# Patient Record
Sex: Female | Born: 1995 | Race: White | Hispanic: No | Marital: Married | Smoking: Never smoker
Health system: Southern US, Community
[De-identification: ages and names within clinical notes are randomized; demographics above are authoritative.]

## PROBLEM LIST (undated history)

## (undated) DIAGNOSIS — O039 Complete or unspecified spontaneous abortion without complication: Secondary | ICD-10-CM

## (undated) HISTORY — DX: Complete or unspecified spontaneous abortion without complication: O03.9

---
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
## 2017-11-10 NOTE — Progress Notes (Signed)
Subjective:    Patient ID: Katelyn Patton, female    DOB: Nov 24, 1995, 22 y.o.   MRN: [id number]  HPI:  [name] presents to establish as a new pt.  She is a pleasant 22 year old female. PMH: She denies chronic medical conditions or daily rx medications.  She reports OTC prenatal vitamins She estimates to drink >100 oz water/day and has been reducing sugar/CHO intake She is currently breastfeeding and plans to continue to until her daughter turns 1 (she is currently 592 months old). She denies formal exercise, but plans on increasing daily walking, swimming, and yoga practice She is married and currently doesn't work outside of the house   Patient Care Team    Relationship Specialty Notifications Start End  Rhiannan Kievit, Katelyn BlossomKaty D, NP PCP - General Family Medicine  11/11/17     Patient Active Problem List   Diagnosis Date Noted  . Healthcare maintenance 11/11/2017     Past Medical History:  Diagnosis Date  . SAB (spontaneous abortion)    history     History reviewed. No pertinent surgical history.   Family History  Problem Relation Age of Onset  . Alcohol abuse Mother   . Alcohol abuse Father   . Cancer Maternal Grandmother   . Diabetes Maternal Grandmother   . Cancer Maternal Grandfather   . Diabetes Maternal Grandfather   . Cancer Paternal Grandfather   . COPD Paternal Grandfather   . Diabetes Paternal Grandfather   . Diabetes Paternal Grandmother      Social History   Substance and Sexual Activity  Drug Use No     Social History   Substance and Sexual Activity  Alcohol Use No  . Alcohol/week: 0.0 oz     Social History   Tobacco Use  Smoking Status Never Smoker  Smokeless Tobacco Never Used     Outpatient Encounter Medications as of 11/11/2017  Medication Sig  . [DISCONTINUED] drospirenone-ethinyl estradiol (YAZ) 3-0.02 MG tablet Take 1 tablet by mouth daily.  . [DISCONTINUED] ondansetron (ZOFRAN ODT) 4 MG disintegrating tablet Take 1 tablet (4 mg  total) by mouth every 8 (eight) hours as needed for nausea or vomiting.  . [DISCONTINUED] oseltamivir (TAMIFLU) 75 MG capsule Take 1 capsule (75 mg total) by mouth every 12 (twelve) hours.   No facility-administered encounter medications on file as of 11/11/2017.     Allergies: Cinnamon and Lactose intolerance (gi)  Body mass index is 34.65 kg/m.  Blood pressure 119/79, pulse 84, height 5' 2.09" (1.577 m), weight 190 lb (86.2 kg), last menstrual period 10/28/2017, SpO2 96 %. Review of Systems  Constitutional: Negative for activity change, appetite change, chills, diaphoresis, fatigue, fever and unexpected weight change.  HENT: Negative for congestion.   Eyes: Negative for visual disturbance.  Respiratory: Negative for cough, chest tightness, shortness of breath, wheezing and stridor.   Cardiovascular: Negative for chest pain, palpitations and leg swelling.  Gastrointestinal: Negative for abdominal distention, abdominal pain, blood in stool, constipation, diarrhea, nausea and vomiting.  Endocrine: Negative for cold intolerance, heat intolerance, polydipsia, polyphagia and polyuria.  Genitourinary: Negative for difficulty urinating and flank pain.  Musculoskeletal: Negative for arthralgias, back pain, gait problem, joint swelling, myalgias, neck pain and neck stiffness.  Skin: Negative for color change, pallor, rash and wound.  Neurological: Negative for dizziness and headaches.  Hematological: Does not bruise/bleed easily.  Psychiatric/Behavioral: Negative for confusion, decreased concentration, dysphoric mood, hallucinations, self-injury, sleep disturbance and suicidal ideas. The patient is not nervous/anxious and is not hyperactive.  Objective:   Physical Exam  Constitutional: She is oriented to person, place, and time. She appears well-developed and well-nourished. No distress.  HENT:  Head: Normocephalic and atraumatic.  Right Ear: External ear normal.  Left Ear: External  ear normal.  Cardiovascular: Normal rate, regular rhythm, normal heart sounds and intact distal pulses.  Pulmonary/Chest: Effort normal and breath sounds normal. No stridor. No respiratory distress. She has no wheezes. She has no rales. She exhibits no tenderness.  Neurological: She is alert and oriented to person, place, and time.  Skin: Skin is warm and dry. Capillary refill takes less than 2 seconds. No rash noted. She is not diaphoretic. No erythema. No pallor.  Psychiatric: She has a normal mood and affect. Her behavior is normal. Judgment and thought content normal.  Nursing note and vitals reviewed.     Assessment & Plan:   1. Healthcare maintenance     Healthcare maintenance Continue your excellent water intake and follow Mediterranean diet Increase regular exercise.  Recommend at least 30 minutes daily, 5 days per week of walking, jogging, biking, swimming, YouTube/Pinterest workout videos. Recommend annual physical.  FOLLOW-UP:  Return in about 1 year (around 11/12/2018) for CPE.

## 2017-11-11 ENCOUNTER — Ambulatory Visit (INDEPENDENT_AMBULATORY_CARE_PROVIDER_SITE_OTHER): Payer: PRIVATE HEALTH INSURANCE | Admitting: Adult Health

## 2017-11-11 ENCOUNTER — Encounter: Payer: Self-pay | Admitting: Adult Health

## 2017-11-11 VITALS — BP 119/79 | HR 84 | Ht 62.09 in | Wt 190.0 lb

## 2017-11-11 DIAGNOSIS — Z0000 Encounter for general adult medical examination without abnormal findings: Secondary | ICD-10-CM | POA: Diagnosis not present

## 2017-11-11 NOTE — Patient Instructions (Signed)
Mediterranean Diet A Mediterranean diet refers to food and lifestyle choices that are based on the traditions of countries located on the [place]. This way of eating has been shown to help prevent certain conditions and improve outcomes for people who have chronic diseases, like kidney disease and heart disease. What are tips for following this plan? Lifestyle  Cook and eat meals together with your family, when possible.  Drink enough fluid to keep your urine clear or pale yellow.  Be physically active every day. This includes: ? Aerobic exercise like running or swimming. ? Leisure activities like gardening, walking, or housework.  Get 7-8 hours of sleep each night.  If recommended by your health care provider, drink red wine in moderation. This means 1 glass a day for nonpregnant women and 2 glasses a day for men. A glass of wine equals 5 oz (150 mL). Reading food labels  Check the serving size of packaged foods. For foods such as rice and pasta, the serving size refers to the amount of cooked product, not dry.  Check the total fat in packaged foods. Avoid foods that have saturated fat or trans fats.  Check the ingredients list for added sugars, such as corn syrup. Shopping  At the grocery store, buy most of your food from the areas near the walls of the store. This includes: ? Fresh fruits and vegetables (produce). ? Grains, beans, nuts, and seeds. Some of these may be available in unpackaged forms or large amounts (in bulk). ? Fresh seafood. ? Poultry and eggs. ? Low-fat dairy products.  Buy whole ingredients instead of prepackaged foods.  Buy fresh fruits and vegetables in-season from local farmers markets.  Buy frozen fruits and vegetables in resealable bags.  If you do not have access to quality fresh seafood, buy precooked frozen shrimp or canned fish, such as tuna, salmon, or sardines.  Buy small amounts of raw or cooked vegetables, salads, or olives from the  deli or salad bar at your store.  Stock your pantry so you always have certain foods on hand, such as olive oil, canned tuna, canned tomatoes, rice, pasta, and beans. Cooking  Cook foods with extra-virgin olive oil instead of using butter or other vegetable oils.  Have meat as a side dish, and have vegetables or grains as your main dish. This means having meat in small portions or adding small amounts of meat to foods like pasta or stew.  Use beans or vegetables instead of meat in common dishes like chili or lasagna.  Experiment with different cooking methods. Try roasting or broiling vegetables instead of steaming or sauteing them.  Add frozen vegetables to soups, stews, pasta, or rice.  Add nuts or seeds for added healthy fat at each meal. You can add these to yogurt, salads, or vegetable dishes.  Marinate fish or vegetables using olive oil, lemon juice, garlic, and fresh herbs. Meal planning  Plan to eat 1 vegetarian meal one day each week. Try to work up to 2 vegetarian meals, if possible.  Eat seafood 2 or more times a week.  Have healthy snacks readily available, such as: ? Vegetable sticks with hummus. ? Greek yogurt. ? Fruit and nut trail mix.  Eat balanced meals throughout the week. This includes: ? Fruit: 2-3 servings a day ? Vegetables: 4-5 servings a day ? Low-fat dairy: 2 servings a day ? Fish, poultry, or lean meat: 1 serving a day ? Beans and legumes: 2 or more servings a week ? Nuts   and seeds: 1-2 servings a day ? Whole grains: 6-8 servings a day ? Extra-virgin olive oil: 3-4 servings a day  Limit red meat and sweets to only a few servings a month What are my food choices?  Mediterranean diet ? Recommended ? Grains: Whole-grain pasta. Brown rice. Bulgar wheat. Polenta. Couscous. Whole-wheat bread. [place]. ? Vegetables: Artichokes. Beets. Broccoli. Cabbage. Carrots. Eggplant. Green beans. Chard. Kale. Spinach. Onions. Leeks. Peas. Squash.  Tomatoes. Peppers. Radishes. ? Fruits: Apples. Apricots. Avocado. Berries. Bananas. Cherries. Dates. Figs. Grapes. Lemons. Melon. Oranges. Peaches. Plums. Pomegranate. ? Meats and other protein foods: Beans. Almonds. Sunflower seeds. Pine nuts. Peanuts. [place]. Salmon. Scallops. Shrimp. [place]. Tilapia. Clams. Oysters. Eggs. ? Dairy: Low-fat milk. Cheese. Greek yogurt. ? Beverages: Water. Red wine. Herbal tea. ? Fats and oils: Extra virgin olive oil. Avocado oil. Grape seed oil. ? Sweets and desserts: [place] yogurt with honey. Baked apples. Poached pears. Trail mix. ? Seasoning and other foods: Basil. Cilantro. Coriander. Cumin. Mint. Parsley. Sage. Rosemary. Tarragon. Garlic. Oregano. Thyme. Pepper. Balsalmic vinegar. Tahini. Hummus. Tomato sauce. Olives. Mushrooms. ? Limit these ? Grains: Prepackaged pasta or rice dishes. Prepackaged cereal with added sugar. ? Vegetables: Deep fried potatoes (french fries). ? Fruits: Fruit canned in syrup. ? Meats and other protein foods: Beef. Pork. Lamb. Poultry with skin. Hot dogs. Berniece Salines. ? Dairy: Ice cream. Sour cream. Whole milk. ? Beverages: Juice. Sugar-sweetened soft drinks. Beer. Liquor and spirits. ? Fats and oils: Butter. Canola oil. Vegetable oil. Beef fat (tallow). Lard. ? Sweets and desserts: Cookies. Cakes. Pies. Candy. ? Seasoning and other foods: Mayonnaise. Premade sauces and marinades. ? The items listed may not be a complete list. Talk with your dietitian about what dietary choices are right for you. Summary  The Mediterranean diet includes both food and lifestyle choices.  Eat a variety of fresh fruits and vegetables, beans, nuts, seeds, and whole grains.  Limit the amount of red meat and sweets that you eat.  Talk with your health care provider about whether it is safe for you to drink red wine in moderation. This means 1 glass a day for nonpregnant women and 2 glasses a day for men. A glass of wine equals 5 oz (150 mL). This information  is not intended to replace advice given to you by your health care provider. Make sure you discuss any questions you have with your health care provider. Document Released: 01/03/2016 Document Revised: 02/05/2016 Document Reviewed: 01/03/2016 Elsevier Interactive Patient Education  2018 [employer].   Exercising to [employer] Exercising can help you to lose weight. In order to lose weight through exercise, you need to do vigorous-intensity exercise. You can tell that you are exercising with vigorous intensity if you are breathing very hard and fast and cannot hold a conversation while exercising. Moderate-intensity exercise helps to maintain your current weight. You can tell that you are exercising at a moderate level if you have a higher heart rate and faster breathing, but you are still able to hold a conversation. How often should I exercise? Choose an activity that you enjoy and set realistic goals. Your health care provider can help you to make an activity plan that works for you. Exercise regularly as directed by your health care provider. This may include:  Doing resistance training twice each week, such as: ? Push-ups. ? Sit-ups. ? Lifting weights. ? Using resistance bands.  Doing a given intensity of exercise for a given amount of time. Choose from these options: ? 150  minutes of moderate-intensity exercise every week. ? 75 minutes of vigorous-intensity exercise every week. ? A mix of moderate-intensity and vigorous-intensity exercise every week.  Children, pregnant women, people who are out of shape, people who are overweight, and older adults may need to consult a health care provider for individual recommendations. If you have any sort of medical condition, be sure to consult your health care provider before starting a new exercise program. What are some activities that can help me to lose weight?  Walking at a rate of at least 4.5 miles an hour.  Jogging or running at a  rate of 5 miles per hour.  Biking at a rate of at least 10 miles per hour.  Lap swimming.  Roller-skating or in-line skating.  Cross-country skiing.  Vigorous competitive sports, such as football, basketball, and soccer.  Jumping rope.  Aerobic dancing. How can I be more active in my day-to-day activities?  Use the stairs instead of the elevator.  Take a walk during your lunch break.  If you drive, park your car farther away from work or school.  If you take public transportation, get off one stop early and walk the rest of the way.  Make all of your phone calls while standing up and walking around.  Get up, stretch, and walk around every 30 minutes throughout the day. What guidelines should I follow while exercising?  Do not exercise so much that you hurt yourself, feel dizzy, or get very short of breath.  Consult your health care provider prior to starting a new exercise program.  Wear comfortable clothes and shoes with good support.  Drink plenty of water while you exercise to prevent dehydration or heat stroke. Body water is lost during exercise and must be replaced.  Work out until you breathe faster and your heart beats faster. This information is not intended to replace advice given to you by your health care provider. Make sure you discuss any questions you have with your health care provider. Document Released: 06/14/2010 Document Revised: 10/18/2015 Document Reviewed: 10/13/2013 Elsevier Interactive Patient Education  2018 [employer]  Continue your excellent water intake and follow Mediterranean diet Increase regular exercise.  Recommend at least 30 minutes daily, 5 days per week of walking, jogging, biking, swimming, YouTube/Pinterest workout videos. Recommend annual physical. WELCOME TO THE PRACTICE!

## 2017-11-11 NOTE — Assessment & Plan Note (Signed)
Continue your excellent water intake and follow Mediterranean diet Increase regular exercise.  Recommend at least 30 minutes daily, 5 days per week of walking, jogging, biking, swimming, YouTube/Pinterest workout videos. Recommend annual physical.

## 2018-02-25 ENCOUNTER — Ambulatory Visit: Payer: PRIVATE HEALTH INSURANCE
